# Patient Record
Sex: Female | Born: 1975 | Race: Black or African American | Hispanic: No | Marital: Married | Smoking: Former smoker
Health system: Southern US, Community
[De-identification: ages and names within clinical notes are randomized; demographics above are authoritative.]

## PROBLEM LIST (undated history)

## (undated) DIAGNOSIS — K589 Irritable bowel syndrome without diarrhea: Secondary | ICD-10-CM

## (undated) DIAGNOSIS — K219 Gastro-esophageal reflux disease without esophagitis: Secondary | ICD-10-CM

## (undated) DIAGNOSIS — D649 Anemia, unspecified: Secondary | ICD-10-CM

## (undated) DIAGNOSIS — E119 Type 2 diabetes mellitus without complications: Secondary | ICD-10-CM

## (undated) DIAGNOSIS — E282 Polycystic ovarian syndrome: Secondary | ICD-10-CM

## (undated) HISTORY — PX: POLYPECTOMY: SHX149

---

## 1997-11-20 ENCOUNTER — Emergency Department (HOSPITAL_COMMUNITY): Admission: EM | Admit: 1997-11-20 | Discharge: 1997-11-20 | Payer: Self-pay | Admitting: Emergency Medicine

## 1998-03-08 ENCOUNTER — Emergency Department (HOSPITAL_COMMUNITY): Admission: EM | Admit: 1998-03-08 | Discharge: 1998-03-09 | Payer: Self-pay | Admitting: Emergency Medicine

## 1998-06-03 ENCOUNTER — Emergency Department (HOSPITAL_COMMUNITY): Admission: EM | Admit: 1998-06-03 | Discharge: 1998-06-04 | Payer: Self-pay | Admitting: Internal Medicine

## 1999-06-02 ENCOUNTER — Emergency Department (HOSPITAL_COMMUNITY): Admission: EM | Admit: 1999-06-02 | Discharge: 1999-06-03 | Payer: Self-pay | Admitting: Emergency Medicine

## 1999-06-09 ENCOUNTER — Encounter: Admission: RE | Admit: 1999-06-09 | Discharge: 1999-06-09 | Payer: Self-pay | Admitting: Internal Medicine

## 1999-06-19 ENCOUNTER — Inpatient Hospital Stay (HOSPITAL_COMMUNITY): Admission: AD | Admit: 1999-06-19 | Discharge: 1999-06-19 | Payer: Self-pay | Admitting: Obstetrics & Gynecology

## 1999-06-24 ENCOUNTER — Encounter: Admission: RE | Admit: 1999-06-24 | Discharge: 1999-06-24 | Payer: Self-pay | Admitting: Obstetrics & Gynecology

## 1999-07-14 ENCOUNTER — Encounter: Admission: RE | Admit: 1999-07-14 | Discharge: 1999-07-14 | Payer: Self-pay | Admitting: Internal Medicine

## 2000-09-22 ENCOUNTER — Other Ambulatory Visit: Admission: RE | Admit: 2000-09-22 | Discharge: 2000-09-22 | Payer: Self-pay | Admitting: Obstetrics and Gynecology

## 2000-12-21 ENCOUNTER — Emergency Department (HOSPITAL_COMMUNITY): Admission: EM | Admit: 2000-12-21 | Discharge: 2000-12-22 | Payer: Self-pay | Admitting: Emergency Medicine

## 2000-12-21 ENCOUNTER — Encounter: Payer: Self-pay | Admitting: Emergency Medicine

## 2000-12-29 ENCOUNTER — Encounter: Admission: RE | Admit: 2000-12-29 | Discharge: 2000-12-29 | Payer: Self-pay | Admitting: Gastroenterology

## 2000-12-29 ENCOUNTER — Encounter: Payer: Self-pay | Admitting: Gastroenterology

## 2001-02-14 ENCOUNTER — Ambulatory Visit (HOSPITAL_COMMUNITY): Admission: RE | Admit: 2001-02-14 | Discharge: 2001-02-14 | Payer: Self-pay | Admitting: Gastroenterology

## 2001-11-01 ENCOUNTER — Other Ambulatory Visit: Admission: RE | Admit: 2001-11-01 | Discharge: 2001-11-01 | Payer: Self-pay | Admitting: Obstetrics and Gynecology

## 2002-11-02 ENCOUNTER — Other Ambulatory Visit: Admission: RE | Admit: 2002-11-02 | Discharge: 2002-11-02 | Payer: Self-pay | Admitting: *Deleted

## 2003-10-11 ENCOUNTER — Ambulatory Visit (HOSPITAL_COMMUNITY): Admission: RE | Admit: 2003-10-11 | Discharge: 2003-10-11 | Payer: Self-pay | Admitting: Obstetrics and Gynecology

## 2003-11-07 ENCOUNTER — Other Ambulatory Visit: Admission: RE | Admit: 2003-11-07 | Discharge: 2003-11-07 | Payer: Self-pay | Admitting: Obstetrics and Gynecology

## 2004-11-05 ENCOUNTER — Other Ambulatory Visit: Admission: RE | Admit: 2004-11-05 | Discharge: 2004-11-05 | Payer: Self-pay | Admitting: Obstetrics and Gynecology

## 2010-06-15 ENCOUNTER — Encounter: Payer: Self-pay | Admitting: Family Medicine

## 2011-06-06 ENCOUNTER — Emergency Department (HOSPITAL_COMMUNITY): Payer: Managed Care, Other (non HMO)

## 2011-06-06 ENCOUNTER — Other Ambulatory Visit: Payer: Self-pay

## 2011-06-06 ENCOUNTER — Encounter (HOSPITAL_COMMUNITY): Payer: Self-pay | Admitting: *Deleted

## 2011-06-06 ENCOUNTER — Emergency Department (HOSPITAL_COMMUNITY)
Admission: EM | Admit: 2011-06-06 | Discharge: 2011-06-06 | Disposition: A | Discharge status: Home / Self Care | Payer: Managed Care, Other (non HMO) | Attending: Emergency Medicine | Admitting: Emergency Medicine

## 2011-06-06 DIAGNOSIS — R05 Cough: Secondary | ICD-10-CM | POA: Insufficient documentation

## 2011-06-06 DIAGNOSIS — R0602 Shortness of breath: Secondary | ICD-10-CM | POA: Insufficient documentation

## 2011-06-06 DIAGNOSIS — R0789 Other chest pain: Secondary | ICD-10-CM | POA: Insufficient documentation

## 2011-06-06 DIAGNOSIS — R059 Cough, unspecified: Secondary | ICD-10-CM | POA: Insufficient documentation

## 2011-06-06 LAB — CBC
Hemoglobin: 11.4 g/dL — ABNORMAL LOW (ref 12.0–15.0)
MCHC: 32.8 g/dL (ref 30.0–36.0)
RBC: 4.6 MIL/uL (ref 3.87–5.11)
WBC: 7.1 10*3/uL (ref 4.0–10.5)

## 2011-06-06 LAB — BASIC METABOLIC PANEL
GFR calc non Af Amer: >90 mL/min (ref >90)
Glucose, Bld: 171 mg/dL — ABNORMAL HIGH (ref 70–99)
Potassium: 3.8 mEq/L (ref 3.5–5.1)
Sodium: 136 mEq/L (ref 135–145)

## 2011-06-06 LAB — URINALYSIS, ROUTINE W REFLEX MICROSCOPIC
Hgb urine dipstick: NEGATIVE
Nitrite: NEGATIVE
Specific Gravity, Urine: 1.024 (ref 1.005–1.030)
Urobilinogen, UA: 0.2 mg/dL (ref 0.0–1.0)

## 2011-06-06 LAB — POCT I-STAT TROPONIN I: Troponin i, poc: 0 ng/mL (ref 0.00–0.08)

## 2011-06-06 LAB — POCT PREGNANCY, URINE: Preg Test, Ur: NEGATIVE

## 2011-06-06 LAB — URINE MICROSCOPIC-ADD ON

## 2011-06-06 NOTE — ED Notes (Signed)
The pt is c/o chest pain for one hour she has a cold and sinus infectiion.

## 2011-06-06 NOTE — ED Provider Notes (Signed)
History     CSN: [id number]  Arrival date & time 06/06/11  2044   First MD Initiated Contact with Patient 06/06/11 2222      Chief Complaint  Patient presents with  . Chest Pain    (Consider location/radiation/quality/duration/timing/severity/associated sxs/prior treatment) HPI History provided by pt.   Pt developed constant, diffuse, right-sided CP w/ associated SOB while driving approx 3 hours ago.  Has had similar sx in the past, always non-exertional, but has never been evaluated.  Has had a cough for the past few days.  No known fever.  No recent trauma or heavy lifting.  H/o acid reflux but this pain feels different.  RF for ACS include FH.    History reviewed. No pertinent past medical history.  History reviewed. No pertinent past surgical history.  History reviewed. No pertinent family history.  History  Substance Use Topics  . Smoking status: Never Smoker   . Smokeless tobacco: Not on file  . Alcohol Use: Yes    OB History    Grav Para Term Preterm Abortions TAB SAB Ect Mult Living                  Review of Systems  All other systems reviewed and are negative.    Allergies  Review of patient's allergies indicates no known allergies.  Home Medications   Current Outpatient Rx  Name Route Sig Dispense Refill  . FAMOTIDINE 20 MG PO TABS Oral Take 40 mg by mouth 2 (two) times daily.    . IBUPROFEN 200 MG PO TABS Oral Take 200 mg by mouth every 6 (six) hours as needed. For pain      BP 123/84  Pulse 88  Temp(Src) 98.4 F (36.9 C) (Oral)  Resp 18  SpO2 97%  LMP 05/06/2011  Physical Exam  Nursing note and vitals reviewed. Constitutional: She is oriented to person, place, and time. She appears well-developed and well-nourished. No distress.  HENT:  Head: Normocephalic and atraumatic.  Eyes:       Normal appearance  Neck: Normal range of motion.  Cardiovascular: Normal rate and regular rhythm.   Pulmonary/Chest: Effort normal and breath sounds  normal. No respiratory distress.       Diffuse right-sided chest tenderness.  Pain reproduced by flexion and and abduction of RUE against resistance as well.   Musculoskeletal:       No peripheral edema or calf ttp  Neurological: She is alert and oriented to person, place, and time.  Skin: Skin is warm and dry. No rash noted.  Psychiatric: She has a normal mood and affect. Her behavior is normal.    ED Course  Procedures (including critical care time)  Date: 06/07/2011  Rate: 87  Rhythm: normal sinus rhythm  QRS Axis: normal  Intervals: normal  ST/T Wave abnormalities: normal  Conduction Disutrbances:none  Narrative Interpretation:   Old EKG Reviewed: none available   Labs Reviewed  CBC - Abnormal; Notable for the following:    Hemoglobin 11.4 (*)    HCT 34.8 (*)    MCV 75.7 (*)    MCH 24.8 (*)    RDW 15.9 (*)    All other components within normal limits  BASIC METABOLIC PANEL - Abnormal; Notable for the following:    Glucose, Bld 171 (*)    All other components within normal limits  URINALYSIS, ROUTINE W REFLEX MICROSCOPIC - Abnormal; Notable for the following:    APPearance CLOUDY (*)    Leukocytes, UA TRACE (*)  All other components within normal limits  URINE MICROSCOPIC-ADD ON - Abnormal; Notable for the following:    Squamous Epithelial / LPF FEW (*)    Bacteria, UA FEW (*)    All other components within normal limits  POCT PREGNANCY, URINE  POCT I-STAT TROPONIN I  I-STAT TROPONIN I  POCT PREGNANCY, URINE   Dg Chest 2 View  06/06/2011  *RADIOLOGY REPORT*  Clinical Data: Right-sided chest pain and shortness of breath.  CHEST - 2 VIEW  Comparison: None.  Findings: The lungs are well-aerated and clear.  There is no evidence of focal opacification, pleural effusion or pneumothorax.  The heart is normal in size; the mediastinal contour is within normal limits.  No acute osseous abnormalities are seen.  IMPRESSION: No acute cardiopulmonary process seen.  Original  Report Authenticated By: Tonia Ghent, M.D.     1. Musculoskeletal chest pain       MDM  Healthy 35yo F, low risk for ACS and PE presents w/ atypical CP that is reproducible on exam.  EKG non-ischemic, troponin neg and CXR and rest of labs unremarkable.  Pt has been reassured that pain is likely musculoskeletal.  Recommended NSAID, rest and close f/u with PCP.  Strict return precautions discussed.         Otilio Miu, [place] 06/07/11 [phone]

## 2011-06-06 NOTE — ED Notes (Signed)
She does not know when her last period was it is irregulat

## 2011-06-06 NOTE — ED Notes (Signed)
Pt placed on cardiac monitor and pulse ox.

## 2011-06-06 NOTE — ED Notes (Signed)
Pt states she has been having right sided CP that radiates to the upper right side of her back since 2030 while at the grocery store.  Pt states she has been also having a lot of gas.  Pt takes prevacid for acid reflux and took that this morning.  Pt stated she took some baking soda to relieve the chest pain.  Last bowel movement was today.  Pt also states she has been having sense of urgency to urinate.  Denies dysuria

## 2011-06-06 NOTE — ED Notes (Signed)
EKG completed by EMT Les. Given to Dr. Ranae Palms.

## 2011-06-07 NOTE — ED Provider Notes (Signed)
Medical screening examination/treatment/procedure(s) were performed by non-physician practitioner and as supervising physician I was immediately available for consultation/collaboration.   Geoffery Lyons, MD 06/07/11 2045

## 2013-10-24 ENCOUNTER — Encounter (HOSPITAL_COMMUNITY): Payer: Self-pay | Admitting: Emergency Medicine

## 2013-10-24 DIAGNOSIS — R1012 Left upper quadrant pain: Secondary | ICD-10-CM | POA: Insufficient documentation

## 2013-10-24 DIAGNOSIS — K589 Irritable bowel syndrome without diarrhea: Secondary | ICD-10-CM | POA: Insufficient documentation

## 2013-10-24 DIAGNOSIS — K219 Gastro-esophageal reflux disease without esophagitis: Secondary | ICD-10-CM | POA: Insufficient documentation

## 2013-10-24 DIAGNOSIS — Z79899 Other long term (current) drug therapy: Secondary | ICD-10-CM | POA: Insufficient documentation

## 2013-10-24 DIAGNOSIS — E282 Polycystic ovarian syndrome: Secondary | ICD-10-CM | POA: Insufficient documentation

## 2013-10-24 LAB — CBC WITH DIFFERENTIAL/PLATELET
Basophils Absolute: 0.1 10*3/uL (ref 0.0–0.1)
Basophils Relative: 1 % (ref 0–1)
EOS ABS: 0.7 10*3/uL (ref 0.0–0.7)
EOS PCT: 7 % — AB (ref 0–5)
HCT: 37.2 % (ref 36.0–46.0)
Hemoglobin: 12.5 g/dL (ref 12.0–15.0)
LYMPHS ABS: 3.3 10*3/uL (ref 0.7–4.0)
LYMPHS PCT: 29 % (ref 12–46)
MCH: 25.3 pg — ABNORMAL LOW (ref 26.0–34.0)
MCHC: 33.6 g/dL (ref 30.0–36.0)
MCV: 75.3 fL — ABNORMAL LOW (ref 78.0–100.0)
MONO ABS: 0.5 10*3/uL (ref 0.1–1.0)
MONOS PCT: 4 % (ref 3–12)
NEUTROS ABS: 6.5 10*3/uL (ref 1.7–7.7)
NEUTROS PCT: 59 % (ref 43–77)
PLATELETS: 318 10*3/uL (ref 150–400)
RBC: 4.94 MIL/uL (ref 3.87–5.11)
RDW: 15.4 % (ref 11.5–15.5)
WBC: 11.2 10*3/uL — AB (ref 4.0–10.5)

## 2013-10-24 LAB — URINALYSIS, ROUTINE W REFLEX MICROSCOPIC
Glucose, UA: NEGATIVE mg/dL
HGB URINE DIPSTICK: NEGATIVE
KETONES UR: 15 mg/dL — AB
Nitrite: NEGATIVE
PROTEIN: NEGATIVE mg/dL
Specific Gravity, Urine: 1.024 (ref 1.005–1.030)
UROBILINOGEN UA: 1 mg/dL (ref 0.0–1.0)
pH: 5.5 (ref 5.0–8.0)

## 2013-10-24 LAB — COMPREHENSIVE METABOLIC PANEL
ALK PHOS: 60 U/L (ref 39–117)
ALT: 21 U/L (ref 0–35)
AST: 26 U/L (ref 0–37)
Albumin: 3.3 g/dL — ABNORMAL LOW (ref 3.5–5.2)
BUN: 7 mg/dL (ref 6–23)
CALCIUM: 9.1 mg/dL (ref 8.4–10.5)
CO2: 24 mEq/L (ref 19–32)
Chloride: 102 mEq/L (ref 96–112)
Creatinine, Ser: 0.74 mg/dL (ref 0.50–1.10)
GLUCOSE: 145 mg/dL — AB (ref 70–99)
Potassium: 3.7 mEq/L (ref 3.7–5.3)
Sodium: 138 mEq/L (ref 137–147)
TOTAL PROTEIN: 7.8 g/dL (ref 6.0–8.3)
Total Bilirubin: 0.2 mg/dL — ABNORMAL LOW (ref 0.3–1.2)

## 2013-10-24 LAB — URINE MICROSCOPIC-ADD ON

## 2013-10-24 LAB — LIPASE, BLOOD: LIPASE: 62 U/L — AB (ref 11–59)

## 2013-10-24 LAB — PREGNANCY, URINE: Preg Test, Ur: NEGATIVE

## 2013-10-24 NOTE — ED Notes (Signed)
Pt. reports LUQ pain with nausea and diarrhea for 1 week , denies fever or chills. History of IBS.

## 2013-10-25 ENCOUNTER — Emergency Department (HOSPITAL_COMMUNITY)
Admission: EM | Admit: 2013-10-25 | Discharge: 2013-10-25 | Disposition: A | Discharge status: Home / Self Care | Payer: Managed Care, Other (non HMO) | Attending: Emergency Medicine | Admitting: Emergency Medicine

## 2013-10-25 DIAGNOSIS — R109 Unspecified abdominal pain: Secondary | ICD-10-CM

## 2013-10-25 HISTORY — DX: Anemia, unspecified: D64.9

## 2013-10-25 HISTORY — DX: Irritable bowel syndrome, unspecified: K58.9

## 2013-10-25 HISTORY — DX: Polycystic ovarian syndrome: E28.2

## 2013-10-25 HISTORY — DX: Type 2 diabetes mellitus without complications: E11.9

## 2013-10-25 HISTORY — DX: Gastro-esophageal reflux disease without esophagitis: K21.9

## 2013-10-25 MED ORDER — ONDANSETRON 4 MG PO TBDP
ORAL_TABLET | ORAL | Status: AC
  Filled 2013-10-25: qty 1

## 2013-10-25 MED ORDER — HYOSCYAMINE SULFATE 0.125 MG PO TABS: 0.2500 mg | ORAL_TABLET | Freq: Four times a day (QID) | ORAL | Status: AC | PRN

## 2013-10-25 MED ORDER — OXYCODONE-ACETAMINOPHEN 5-325 MG PO TABS: 1.0000 | ORAL_TABLET | ORAL | Status: DC | PRN

## 2013-10-25 MED ORDER — OXYCODONE-ACETAMINOPHEN 5-325 MG PO TABS
2.0000 | ORAL_TABLET | Freq: Once | ORAL | Status: AC
  Administered 2013-10-25: 2 via ORAL
  Filled 2013-10-25: qty 2

## 2013-10-25 MED ORDER — SODIUM CHLORIDE 0.9 % IV SOLN
1000.0000 mL | Freq: Once | INTRAVENOUS | Status: AC
  Administered 2013-10-25: 1000 mL via INTRAVENOUS

## 2013-10-25 MED ORDER — ONDANSETRON 8 MG PO TBDP: 8.0000 mg | ORAL_TABLET | Freq: Three times a day (TID) | ORAL | Status: DC | PRN

## 2013-10-25 MED ORDER — HYDROMORPHONE HCL PF 1 MG/ML IJ SOLN
1.0000 mg | Freq: Once | INTRAMUSCULAR | Status: AC
  Administered 2013-10-25: 1 mg via INTRAVENOUS
  Filled 2013-10-25: qty 1

## 2013-10-25 MED ORDER — HYOSCYAMINE SULFATE 0.125 MG PO TABS
0.2500 mg | ORAL_TABLET | Freq: Once | ORAL | Status: AC
  Filled 2013-10-25: qty 2

## 2013-10-25 MED ORDER — SODIUM CHLORIDE 0.9 % IV SOLN
1000.0000 mL | INTRAVENOUS | Status: DC
  Administered 2013-10-25: 1000 mL via INTRAVENOUS

## 2013-10-25 MED ORDER — HYOSCYAMINE SULFATE 0.125 MG PO TABS
0.2500 mg | ORAL_TABLET | Freq: Once | ORAL | Status: AC
  Administered 2013-10-25: 0.25 mg via ORAL

## 2013-10-25 MED ORDER — ONDANSETRON 4 MG PO TBDP
4.0000 mg | ORAL_TABLET | Freq: Once | ORAL | Status: AC
  Administered 2013-10-25: 4 mg via ORAL

## 2013-10-25 NOTE — ED Provider Notes (Signed)
CSN: [id number]     Arrival date & time 10/24/13  2210 History   First MD Initiated Contact with Patient 10/25/13 0119     Chief Complaint  Patient presents with  . Abdominal Pain      HPI Patient reports worsening left-sided abdominal pain over the past week.  She states she's had nausea and diarrhea and the main issue that brings her in today for diarrhea is persistent.  No blood noted no diarrhea.  No recent travel.  No sick contacts.  She saw her physicians who ordered an outpatient CT.  This was completed approximately 5 days ago demonstrated no abnormalities.  She was placed on Flagyl that time as she is continuing Flagyl currently.  She's not doing anything for pain at home.  She does have a history of IBS.  She denies back pain.  No chest pain or shortness of breath.  Symptoms are mild to moderate in severity.   Past Medical History  Diagnosis Date  . IBS (irritable bowel syndrome)   . Diabetes mellitus without complication   . GERD (gastroesophageal reflux disease)   . PCOS (polycystic ovarian syndrome)   . Anemia    History reviewed. No pertinent past surgical history. No family history on file. History  Substance Use Topics  . Smoking status: Never Smoker   . Smokeless tobacco: Not on file  . Alcohol Use: Yes   OB History   Grav Para Term Preterm Abortions TAB SAB Ect Mult Living                 Review of Systems  All other systems reviewed and are negative.     Allergies  Review of patient's allergies indicates no known allergies.  Home Medications   Prior to Admission medications   Medication Sig Start Date End Date Taking? Authorizing Provider  Ginger, Zingiber officinalis, (GINGER PO) Take 2 capsules by mouth daily.   Yes Historical Provider, MD  ibuprofen (ADVIL,MOTRIN) 200 MG tablet Take 200 mg by mouth every 6 (six) hours as needed. For pain   Yes Historical Provider, MD  lansoprazole (PREVACID) 30 MG capsule Take 30 mg by mouth daily at 12 noon.    Yes Historical Provider, MD  MACA ROOT PO Take 2 tablets by mouth daily.   Yes Historical Provider, MD  metroNIDAZOLE (FLAGYL) 500 MG tablet Take 500 mg by mouth 4 (four) times daily.   Yes Historical Provider, MD  hyoscyamine (LEVSIN, ANASPAZ) 0.125 MG tablet Take 2 tablets (0.25 mg total) by mouth every 6 (six) hours as needed. 10/25/13   Lyanne Co, MD  ondansetron (ZOFRAN ODT) 8 MG disintegrating tablet Take 1 tablet (8 mg total) by mouth every 8 (eight) hours as needed for nausea or vomiting. 10/25/13   Lyanne Co, MD  oxyCODONE-acetaminophen (PERCOCET/ROXICET) 5-325 MG per tablet Take 1 tablet by mouth every 4 (four) hours as needed for severe pain. 10/25/13   Lyanne Co, MD   BP 105/75  Pulse 73  Temp(Src) 98.6 F (37 C) (Oral)  Resp 15  Ht 5' (1.524 m)  Wt 179 lb (81.194 kg)  BMI 34.96 kg/m2  SpO2 100%  LMP 10/07/2013 Physical Exam  Nursing note and vitals reviewed. Constitutional: She is oriented to person, place, and time. She appears well-developed and well-nourished. No distress.  HENT:  Head: Normocephalic and atraumatic.  Eyes: EOM are normal.  Neck: Normal range of motion.  Cardiovascular: Normal rate, regular rhythm and normal heart sounds.  Pulmonary/Chest: Effort normal and breath sounds normal.  Abdominal: Soft. She exhibits no distension. There is no tenderness. There is no rebound and no guarding.  Musculoskeletal: Normal range of motion.  Neurological: She is alert and oriented to person, place, and time.  Skin: Skin is warm and dry.  Psychiatric: She has a normal mood and affect. Judgment normal.    ED Course  Procedures (including critical care time) Labs Review Labs Reviewed  CBC WITH DIFFERENTIAL - Abnormal; Notable for the following:    WBC 11.2 (*)    MCV 75.3 (*)    MCH 25.3 (*)    Eosinophils Relative 7 (*)    All other components within normal limits  COMPREHENSIVE METABOLIC PANEL - Abnormal; Notable for the following:    Glucose, Bld  145 (*)    Albumin 3.3 (*)    Total Bilirubin 0.2 (*)    All other components within normal limits  LIPASE, BLOOD - Abnormal; Notable for the following:    Lipase 62 (*)    All other components within normal limits  URINALYSIS, ROUTINE W REFLEX MICROSCOPIC - Abnormal; Notable for the following:    APPearance CLOUDY (*)    Bilirubin Urine SMALL (*)    Ketones, ur 15 (*)    Leukocytes, UA SMALL (*)    All other components within normal limits  URINE MICROSCOPIC-ADD ON - Abnormal; Notable for the following:    Squamous Epithelial / LPF MANY (*)    Bacteria, UA FEW (*)    All other components within normal limits  PREGNANCY, URINE    Imaging Review [facility] Date of Birth: [birth year] Sex: F Admit Date: 10/20/2013 11:08  ###FINAL RESULT###  INDICATIONS: ABDOMEN PAIN FOR 5-7 DAYS COMMENTS:  PROCEDURE: QCT [id number] CT ABD/PELVIS W/CONT - Oct 20 2013  [facility] Accession #: [id number] [facility] Accession #: [id number] CT ABDOMEN AND PELVIS WITH IV CONTRAST TECHNIQUE: Multiple axial images through the abdomen and pelvis were  performed after the administration of 80 mL of Isovue 370. Coronal and  sagittal reconstructions were obtained and reviewed. COMPARISON: Ultrasound 08/22/2013. INDICATION: Left upper quadrant pain and diarrhea. FINDINGS: Imaged Lower Thorax: Unremarkable Abdomen/Pelvis: No bowel obstruction or abnormal bowel wall thickening. Normal appendix.  Unremarkable small bowel and stomach. Mild hepatic steatosis, no focal  lesion or biliary dilatation. The gallbladder, spleen, adrenal glands and  bladder are unremarkable. The uterus and ovaries are present. Bilateral  renal cysts. No stones or hydronephrosis. No free fluid, free air or adenopathy. Normal caliber abdominal aorta and  IVC. No significant bone abnormality. No destructive bone lesions.  IMPRESSION: No acute findings to explain etiology of patients pain. Mild hepatic steatosis.      EKG  Interpretation None      MDM   Final diagnoses:  Abdominal pain    3:58 AM Patient feels better at this time.  Labs without significant abnormality.  Mild leukocytosis noted.  Patient is completing her course of Flagyl at this time.  She'll continue to complete this course.  GI followup.  The patient's pain controlled in the emergency department.  Home with Levsin, Percocet.  She understands return to the ER for new or worsening symptoms    Lyanne CoKevin M Nickisha Hum, MD 10/25/13 0400

## 2013-10-25 NOTE — Discharge Instructions (Signed)
Abdominal Pain, Adult °Many things can cause abdominal pain. Usually, abdominal pain is not caused by a disease and will improve without treatment. It can often be observed and treated at home. Your health care provider will do a physical exam and possibly order blood tests and X-rays to help determine the seriousness of your pain. However, in many cases, more time must pass before a clear cause of the pain can be found. Before that point, your health care provider may not know if you need more testing or further treatment. °HOME CARE INSTRUCTIONS  °Monitor your abdominal pain for any changes. The following actions may help to alleviate any discomfort you are experiencing: °· Only take over-the-counter or prescription medicines as directed by your health care provider. °· Do not take laxatives unless directed to do so by your health care provider. °· Try a clear liquid diet (broth, tea, or water) as directed by your health care provider. Slowly move to a bland diet as tolerated. °SEEK MEDICAL CARE IF: °· You have unexplained abdominal pain. °· You have abdominal pain associated with nausea or diarrhea. °· You have pain when you urinate or have a bowel movement. °· You experience abdominal pain that wakes you in the night. °· You have abdominal pain that is worsened or improved by eating food. °· You have abdominal pain that is worsened with eating fatty foods. °SEEK IMMEDIATE MEDICAL CARE IF:  °· Your pain does not go away within 2 hours. °· You have a fever. °· You keep throwing up (vomiting). °· Your pain is felt only in portions of the abdomen, such as the right side or the left lower portion of the abdomen. °· You pass bloody or black tarry stools. °MAKE SURE YOU: °· Understand these instructions.   °· Will watch your condition.   °· Will get help right away if you are not doing well or get worse.   °Document Released: 02/18/2005 Document Revised: 03/01/2013 Document Reviewed: 01/18/2013 °Exit[employer]® Patient  Information ©2014 [employer]. ° °

## 2019-05-29 ENCOUNTER — Other Ambulatory Visit: Payer: Managed Care, Other (non HMO)

## 2019-06-02 ENCOUNTER — Other Ambulatory Visit: Payer: Managed Care, Other (non HMO)

## 2019-08-11 ENCOUNTER — Ambulatory Visit: Payer: Managed Care, Other (non HMO) | Attending: Internal Medicine

## 2019-08-11 DIAGNOSIS — Z23 Encounter for immunization: Secondary | ICD-10-CM

## 2019-08-11 NOTE — Progress Notes (Signed)
   Covid-19 Vaccination Clinic  Name:  Lindsay Gardner    MRN: [id number] DOB: [birth year]  08/11/2019  Lindsay Gardner was observed post Covid-19 immunization for 15 minutes without incident. She was provided with Vaccine Information Sheet and instruction to access the V-Safe system.   Lindsay Gardner was instructed to call 911 with any severe reactions post vaccine: Marland Kitchen Difficulty breathing  . Swelling of face and throat  . A fast heartbeat  . A bad rash all over body  . Dizziness and weakness   Immunizations Administered    Name Date Dose VIS Date Route   Pfizer COVID-19 Vaccine 08/11/2019  4:57 PM 0.3 mL 05/05/2019 Intramuscular   Manufacturer: [employer], [employer]   Lot: [id number]   NDC: [address]-3

## 2019-09-06 ENCOUNTER — Ambulatory Visit: Payer: Managed Care, Other (non HMO) | Attending: Internal Medicine

## 2019-09-06 DIAGNOSIS — Z23 Encounter for immunization: Secondary | ICD-10-CM

## 2019-09-06 NOTE — Progress Notes (Signed)
   Covid-19 Vaccination Clinic  Name:  Lindsay Gardner    MRN: [id number] DOB: [birth year]  09/06/2019  Ms. Lindsay Gardner was observed post Covid-19 immunization for 15 minutes without incident. She was provided with Vaccine Information Sheet and instruction to access the V-Safe system.   Ms. Lindsay Gardner was instructed to call 911 with any severe reactions post vaccine: Marland Kitchen Difficulty breathing  . Swelling of face and throat  . A fast heartbeat  . A bad rash all over body  . Dizziness and weakness   Immunizations Administered    Name Date Dose VIS Date Route   Pfizer COVID-19 Vaccine 09/06/2019  3:50 PM 0.3 mL 05/05/2019 Intramuscular   Manufacturer: [employer], [employer]   Lot: [id number]   NDC: [address]-4

## 2019-12-11 ENCOUNTER — Ambulatory Visit: Payer: Self-pay

## 2019-12-11 ENCOUNTER — Telehealth: Payer: Managed Care, Other (non HMO)

## 2020-02-21 ENCOUNTER — Ambulatory Visit (INDEPENDENT_AMBULATORY_CARE_PROVIDER_SITE_OTHER): Payer: Managed Care, Other (non HMO) | Admitting: Obstetrics & Gynecology

## 2020-02-21 ENCOUNTER — Other Ambulatory Visit: Payer: Self-pay

## 2020-02-21 ENCOUNTER — Encounter: Payer: Self-pay | Admitting: Obstetrics & Gynecology

## 2020-02-21 ENCOUNTER — Other Ambulatory Visit (HOSPITAL_COMMUNITY)
Admission: RE | Admit: 2020-02-21 | Discharge: 2020-02-21 | Disposition: A | Discharge status: Home / Self Care | Payer: Managed Care, Other (non HMO) | Source: Ambulatory Visit | Attending: Obstetrics & Gynecology | Admitting: Obstetrics & Gynecology

## 2020-02-21 VITALS — BP 117/81 | HR 97 | Ht 61.0 in | Wt 178.0 lb

## 2020-02-21 DIAGNOSIS — Z1231 Encounter for screening mammogram for malignant neoplasm of breast: Secondary | ICD-10-CM | POA: Diagnosis not present

## 2020-02-21 DIAGNOSIS — N939 Abnormal uterine and vaginal bleeding, unspecified: Secondary | ICD-10-CM | POA: Diagnosis not present

## 2020-02-21 DIAGNOSIS — Z1329 Encounter for screening for other suspected endocrine disorder: Secondary | ICD-10-CM

## 2020-02-21 DIAGNOSIS — Z01419 Encounter for gynecological examination (general) (routine) without abnormal findings: Secondary | ICD-10-CM | POA: Diagnosis present

## 2020-02-21 DIAGNOSIS — N920 Excessive and frequent menstruation with regular cycle: Secondary | ICD-10-CM

## 2020-02-21 MED ORDER — TRANEXAMIC ACID 650 MG PO TABS: 1300.0000 mg | ORAL_TABLET | Freq: Three times a day (TID) | ORAL | 2 refills | Status: AC

## 2020-02-21 NOTE — Progress Notes (Signed)
Last mammogram at [facility] center in Sept 2020 Last pap WNL at [facility] Sept 15 2020. Armandina Stammer RN

## 2020-02-21 NOTE — Progress Notes (Signed)
Subjective:     Lindsay Gardner is a 44 y.o. female here for a routine exam. G1P0010 Pt reports heavy cycles leading to anemia for many years. She is on meds for menorrhagia but, has never been managed for the menorrhagia She has had PCOs and had a workup, She was on metformin and clomid prev with no subsequent pregnancy. She bleeds 6-7 days per cycles and heavy with clots.  She has DM which is controlled.   Gynecologic History Patient's last menstrual period was 02/05/2020 (exact date). Contraception: none Last Pap: 1 years prev. Results were: normal Last mammogram: 1 year prev Results were: normal  Obstetric History G1P0010   The following portions of the patient's history were reviewed and updated as appropriate: allergies, current medications, past family history, past medical history, past social history, past surgical history and problem list.  Review of Systems Pertinent items are noted in HPI.    Objective:  BP 117/81   Pulse 97   Ht 5\' 1"  (1.549 m)   Wt 178 lb (80.7 kg)   LMP 02/05/2020 (Exact Date)   BMI 33.63 kg/m   General Appearance:    Alert, cooperative, no distress, appears stated age  Head:    Normocephalic, without obvious abnormality, atraumatic  Eyes:    conjunctiva/corneas clear, EOM's intact, both eyes  Ears:    Normal external ear canals, both ears  Nose:   Nares normal, septum midline, mucosa normal, no drainage    or sinus tenderness  Throat:   Lips, mucosa, and tongue normal; teeth and gums normal  Neck:   Supple, symmetrical, trachea midline, no adenopathy;    thyroid:  no enlargement/tenderness/nodules  Back:     Symmetric, no curvature, ROM normal, no CVA tenderness  Lungs:     respirations unlabored  Chest Wall:    No tenderness or deformity   Heart:    Regular rate and rhythm  Breast Exam:    No tenderness, masses, or nipple abnormality  Abdomen:     Soft, non-tender, bowel sounds active all four quadrants,    no masses, no organomegaly   Genitalia:    Normal female without lesion, discharge or tenderness   Uterus small and mobile.   Extremities:   Extremities normal, atraumatic, no cyanosis or edema  Pulses:   2+ and symmetric all extremities  Skin:   Skin color, texture, turgor normal, no rashes or lesions    Assessment:    Healthy female exam.   H/o PCOS menorrhagia with reg cycles.  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 Her husband is not open ot adoption or fostering. She would consider eval by [facility]. Not ready for referral yet.      Plan:  Lindsay Gardner was seen today for annual exam.  Diagnoses and all orders for this visit:  Well female exam with routine gynecological exam -     CBC -     TSH  Screening for thyroid disorder -     TSH  Screening mammogram, encounter for -     MS Digital Screening; Future  Abnormal uterine bleeding (AUB)  Menorrhagia with regular cycle -     tranexamic acid (LYSTEDA) 650 MG TABS tablet; Take 2 tablets (1,300 mg total) by mouth 3 (three) times daily. Take during menses for a maximum of five days  f/u in 3 months or assess bleeding and Hgb.  Tramond Slinker L. Harraway-Smith, M.D., [place]

## 2020-02-22 LAB — CBC
Hematocrit: 38.4 % (ref 34.0–46.6)
Hemoglobin: 12.9 g/dL (ref 11.1–15.9)
MCH: 26.8 pg (ref 26.6–33.0)
MCHC: 33.6 g/dL (ref 31.5–35.7)
MCV: 80 fL (ref 79–97)
Platelets: 341 10*3/uL (ref 150–450)
RBC: 4.82 x10E6/uL (ref 3.77–5.28)
RDW: 14.6 % (ref 11.7–15.4)
WBC: 6.3 10*3/uL (ref 3.4–10.8)

## 2020-02-22 LAB — TSH: TSH: 2.07 u[IU]/mL (ref 0.450–4.500)

## 2020-02-23 LAB — CYTOLOGY - PAP
Comment: NEGATIVE
Diagnosis: NEGATIVE
High risk HPV: NEGATIVE

## 2020-03-21 ENCOUNTER — Other Ambulatory Visit: Payer: Self-pay

## 2020-03-21 ENCOUNTER — Ambulatory Visit
Admission: RE | Admit: 2020-03-21 | Discharge: 2020-03-21 | Disposition: A | Discharge status: Home / Self Care | Payer: Managed Care, Other (non HMO) | Source: Ambulatory Visit | Attending: Obstetrics & Gynecology | Admitting: Obstetrics & Gynecology

## 2020-03-21 DIAGNOSIS — Z1231 Encounter for screening mammogram for malignant neoplasm of breast: Secondary | ICD-10-CM

## 2020-11-08 ENCOUNTER — Other Ambulatory Visit (HOSPITAL_COMMUNITY)
Admission: RE | Admit: 2020-11-08 | Discharge: 2020-11-08 | Disposition: A | Discharge status: Home / Self Care | Payer: Managed Care, Other (non HMO) | Source: Ambulatory Visit | Attending: Obstetrics & Gynecology | Admitting: Obstetrics & Gynecology

## 2020-11-08 ENCOUNTER — Encounter: Payer: Self-pay | Admitting: Obstetrics & Gynecology

## 2020-11-08 ENCOUNTER — Other Ambulatory Visit: Payer: Self-pay

## 2020-11-08 ENCOUNTER — Ambulatory Visit (INDEPENDENT_AMBULATORY_CARE_PROVIDER_SITE_OTHER): Payer: Managed Care, Other (non HMO) | Admitting: Obstetrics & Gynecology

## 2020-11-08 VITALS — BP 117/73 | HR 91 | Ht 61.0 in | Wt 174.0 lb

## 2020-11-08 DIAGNOSIS — R300 Dysuria: Secondary | ICD-10-CM

## 2020-11-08 DIAGNOSIS — N898 Other specified noninflammatory disorders of vagina: Secondary | ICD-10-CM

## 2020-11-08 LAB — POCT URINALYSIS DIPSTICK
Appearance: NORMAL
Bilirubin, UA: NEGATIVE
Blood, UA: NEGATIVE
Glucose, UA: NEGATIVE
Ketones, UA: NEGATIVE
Leukocytes, UA: NEGATIVE
Nitrite, UA: NEGATIVE
Protein, UA: NEGATIVE
Spec Grav, UA: 1.02 (ref 1.010–1.025)
Urobilinogen, UA: 0.2 E.U./dL
pH, UA: 6 (ref 5.0–8.0)

## 2020-11-08 NOTE — Progress Notes (Signed)
History:  45 y.o. G1P0010 here today for eval of brownish discharge with odor and lower pelvic pain. Pt reports that she initially thought that she has a UTI and she took a course of atbx that she had at home with no change in her sx. She also has concerns of a  forgotten tampon but, the odor is not extreme and has not been getting worse. She is not currently sexually active as her husband has had some medical issues.  Neither the pain or the odor are severe. She did have an episode of dehydration and was urinating a lot after excessive hydration. She wonders if these sx are related.     The following portions of the patient's history were reviewed and updated as appropriate: allergies, current medications, past family history, past medical history, past social history, past surgical history and problem list.  Review of Systems:  Pertinent items are noted in HPI.    Objective:  Physical Exam Blood pressure 117/73, pulse 91, height 5\' 1"  (1.549 m), weight 174 lb (78.9 kg), last menstrual period 10/13/2020.  CONSTITUTIONAL: Well-developed, well-nourished female in no acute distress.  HENT:  Normocephalic, atraumatic EYES: Conjunctivae and EOM are normal. No scleral icterus.  NECK: Normal range of motion SKIN: Skin is warm and dry. No rash noted. Not diaphoretic.No pallor. NEUROLGIC: Alert and oriented to person, place, and time. Normal coordination.  Abd: Soft, nontender and nondistended Pelvic: Normal appearing external genitalia; normal appearing vaginal mucosa and cervix.  Normal discharge.  Physiologic in appearance. Small uterus, no other palpable masses, no uterine or adnexal tenderness  UA: neg Assessment & Plan:  Micah was seen today for vaginal discharge.  Diagnoses and all orders for this visit:  Vaginal discharge -     Cervicovaginal ancillary only( )  Dysuria -     POCT urinalysis dipstick  Will f/u Affirm test.  f/u prn  Natascha Edmonds L. Harraway-Smith, M.D.,  [place]

## 2020-11-08 NOTE — Progress Notes (Signed)
Pt presents today for vaginal discharge with odor for a couple weeks. States she is also having some pelvic pressure. Pt states she also feels tender near her urethra.

## 2020-11-11 LAB — CERVICOVAGINAL ANCILLARY ONLY
Bacterial Vaginitis (gardnerella): NEGATIVE
Candida Glabrata: NEGATIVE
Candida Vaginitis: NEGATIVE
Comment: NEGATIVE
Comment: NEGATIVE
Comment: NEGATIVE

## 2021-02-10 IMAGING — MG DIGITAL SCREENING BILAT W/ TOMO W/ CAD
8 series · 8 of 24 positions shown · non-contrast
Comparison: Previous exam(s).

CLINICAL DATA: Screening.

EXAM:
DIGITAL SCREENING BILATERAL MAMMOGRAM WITH TOMO AND CAD

[R CC synth-2D]
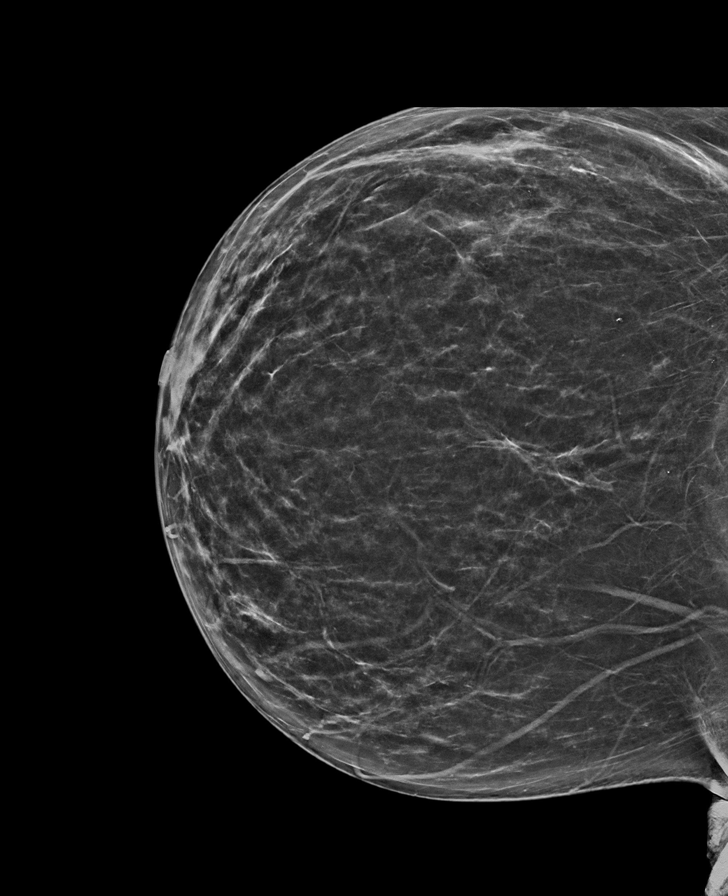

[L CC synth-2D]
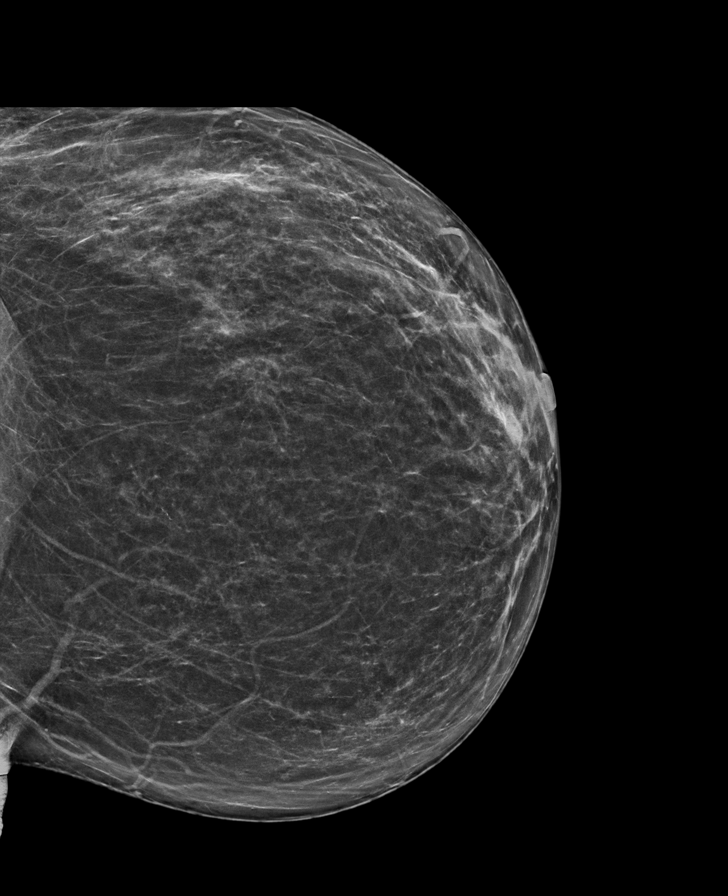

[R MLO synth-2D]
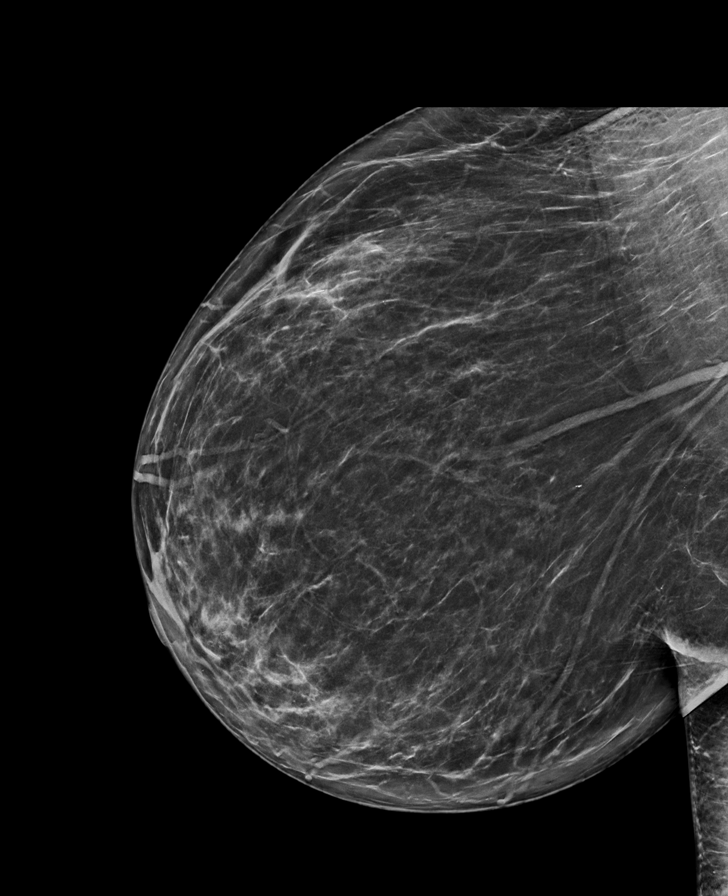

[L MLO synth-2D]
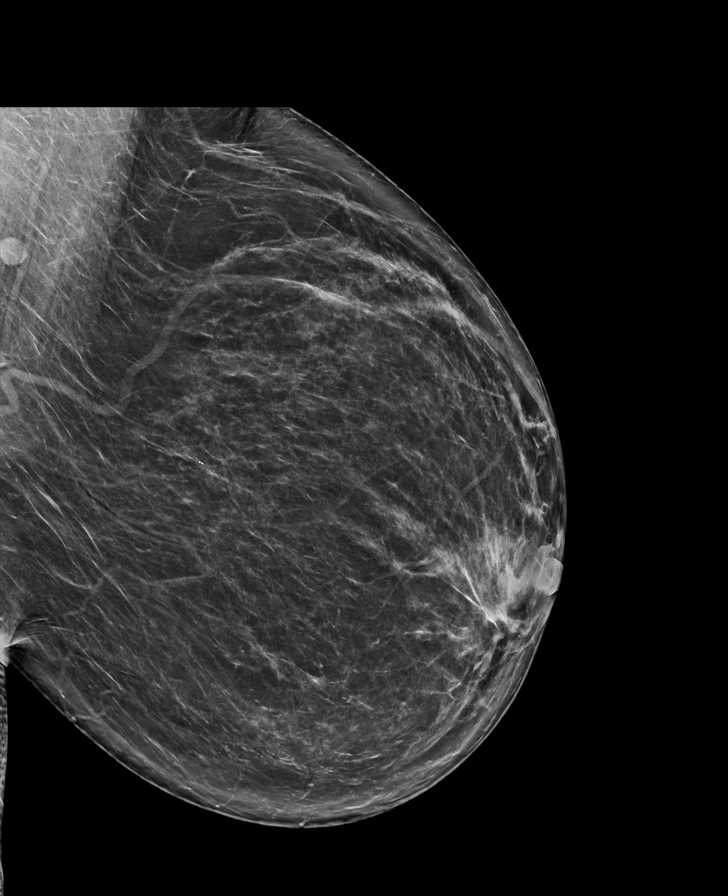

[L MLO tomo · tomo slice 41/80.0]
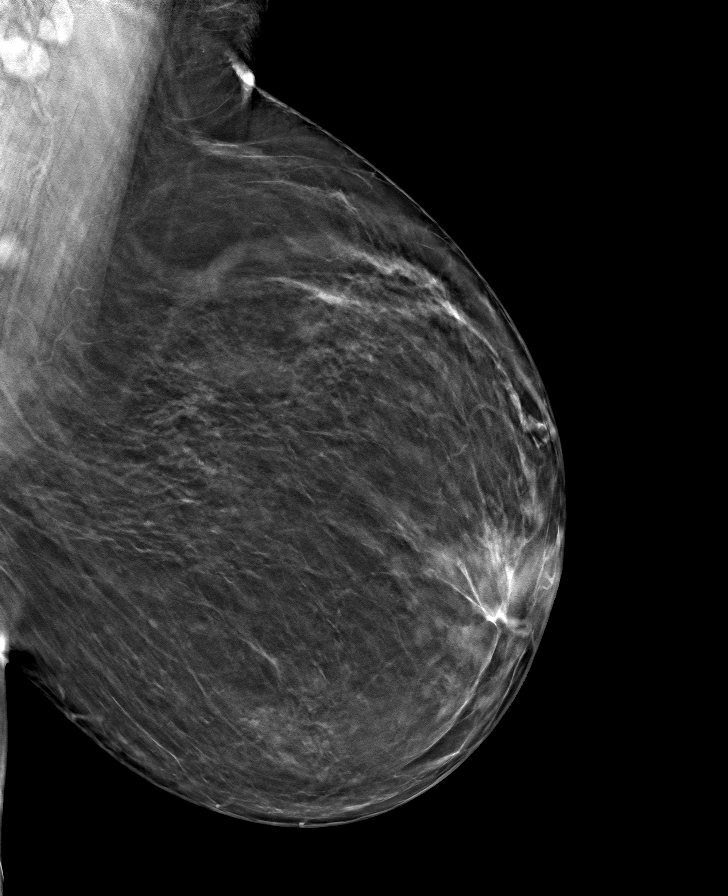

[R CC tomo · tomo slice 35/69.0]
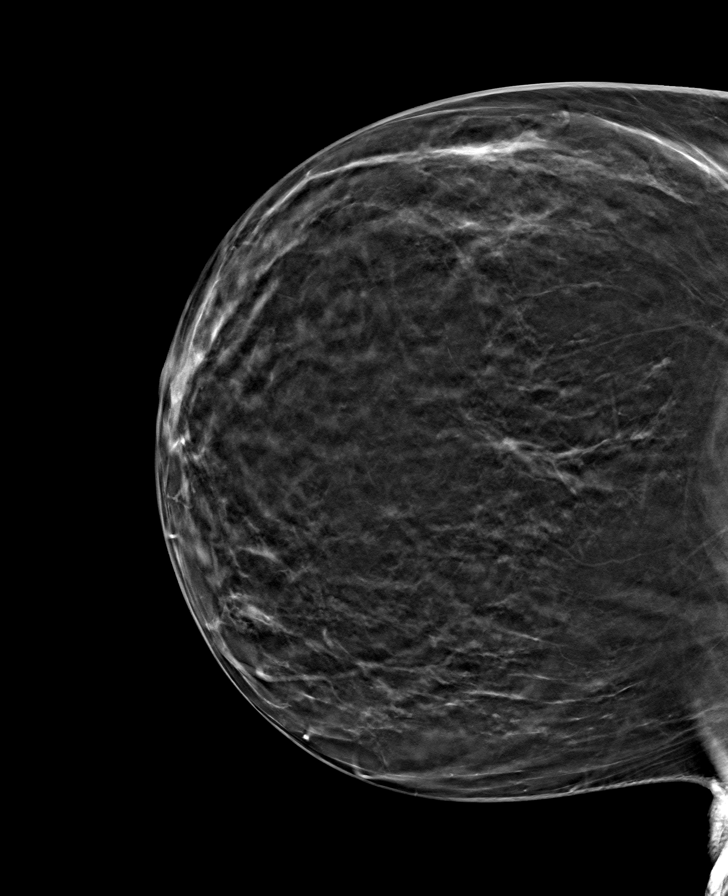

[L CC tomo · tomo slice 36/71.0]
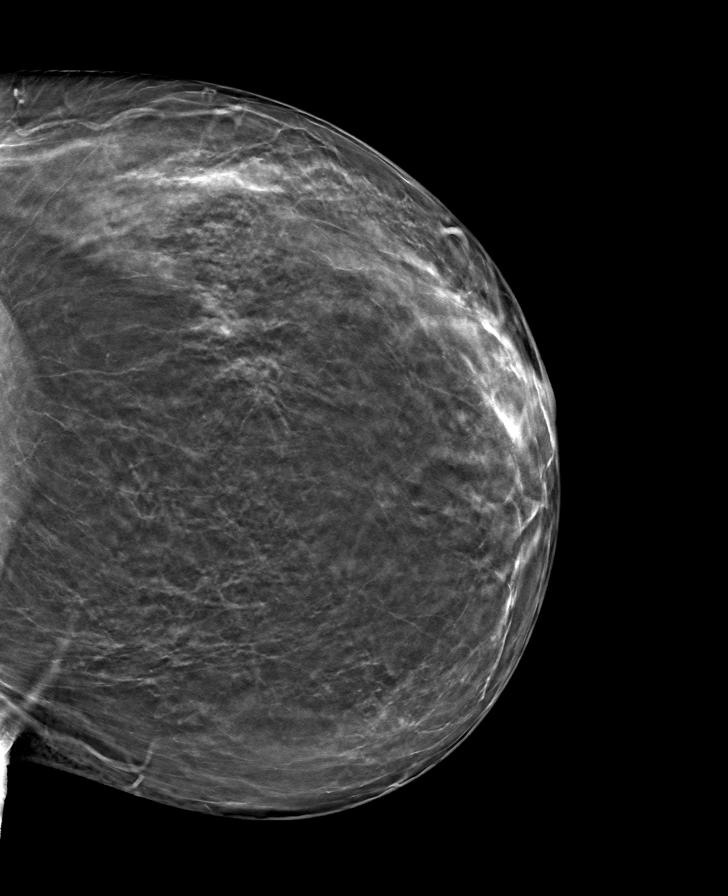

[R MLO tomo · tomo slice 39/77.0]
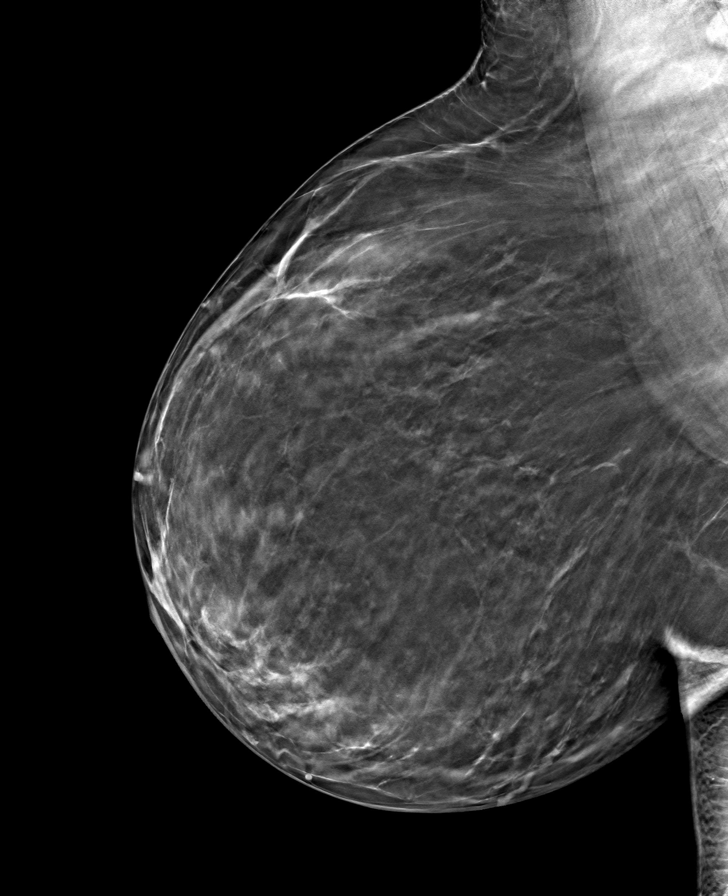

[8 of 24 positions shown; findings below may reference images not displayed]

ACR Breast Density Category b: There are scattered areas of
fibroglandular density.
FINDINGS: There are no findings suspicious for malignancy. Images were
processed with CAD.
IMPRESSION: No mammographic evidence of malignancy. A result letter of this
screening mammogram will be mailed directly to the patient.

RECOMMENDATION:
Screening mammogram in one year. (Code:[id number])

BI-RADS CATEGORY  1: Negative.

## 2021-03-20 ENCOUNTER — Other Ambulatory Visit: Payer: Self-pay | Admitting: Obstetrics & Gynecology

## 2021-03-20 DIAGNOSIS — Z1231 Encounter for screening mammogram for malignant neoplasm of breast: Secondary | ICD-10-CM

## 2021-06-17 ENCOUNTER — Other Ambulatory Visit: Payer: Self-pay

## 2021-06-17 ENCOUNTER — Ambulatory Visit
Admission: RE | Admit: 2021-06-17 | Discharge: 2021-06-17 | Disposition: A | Discharge status: Home / Self Care | Payer: Managed Care, Other (non HMO) | Source: Ambulatory Visit | Attending: Obstetrics & Gynecology | Admitting: Obstetrics & Gynecology

## 2021-06-17 DIAGNOSIS — Z1231 Encounter for screening mammogram for malignant neoplasm of breast: Secondary | ICD-10-CM

## 2021-08-01 ENCOUNTER — Encounter (HOSPITAL_BASED_OUTPATIENT_CLINIC_OR_DEPARTMENT_OTHER): Payer: Self-pay

## 2021-08-01 ENCOUNTER — Emergency Department (HOSPITAL_BASED_OUTPATIENT_CLINIC_OR_DEPARTMENT_OTHER): Payer: Managed Care, Other (non HMO) | Admitting: Radiology

## 2021-08-01 ENCOUNTER — Emergency Department (HOSPITAL_BASED_OUTPATIENT_CLINIC_OR_DEPARTMENT_OTHER)
Admission: EM | Admit: 2021-08-01 | Discharge: 2021-08-01 | Disposition: A | Discharge status: Home / Self Care | Payer: Managed Care, Other (non HMO) | Attending: Emergency Medicine | Admitting: Emergency Medicine

## 2021-08-01 ENCOUNTER — Other Ambulatory Visit: Payer: Self-pay

## 2021-08-01 ENCOUNTER — Emergency Department (HOSPITAL_BASED_OUTPATIENT_CLINIC_OR_DEPARTMENT_OTHER): Payer: Managed Care, Other (non HMO)

## 2021-08-01 DIAGNOSIS — R1032 Left lower quadrant pain: Secondary | ICD-10-CM | POA: Diagnosis present

## 2021-08-01 DIAGNOSIS — R002 Palpitations: Secondary | ICD-10-CM | POA: Insufficient documentation

## 2021-08-01 DIAGNOSIS — R197 Diarrhea, unspecified: Secondary | ICD-10-CM | POA: Diagnosis not present

## 2021-08-01 DIAGNOSIS — R1012 Left upper quadrant pain: Secondary | ICD-10-CM

## 2021-08-01 DIAGNOSIS — E119 Type 2 diabetes mellitus without complications: Secondary | ICD-10-CM | POA: Diagnosis not present

## 2021-08-01 LAB — MAGNESIUM: Magnesium: 1.9 mg/dL (ref 1.7–2.4)

## 2021-08-01 LAB — COMPREHENSIVE METABOLIC PANEL
ALT: 21 U/L (ref 0–44)
AST: 24 U/L (ref 15–41)
Albumin: 4.3 g/dL (ref 3.5–5.0)
Alkaline Phosphatase: 44 U/L (ref 38–126)
Anion gap: 10 (ref 5–15)
BUN: 10 mg/dL (ref 6–20)
CO2: 25 mmol/L (ref 22–32)
Calcium: 9.5 mg/dL (ref 8.9–10.3)
Chloride: 101 mmol/L (ref 98–111)
Creatinine, Ser: 0.97 mg/dL (ref 0.44–1.00)
GFR, Estimated: >60 mL/min (ref >60)
Glucose, Bld: 180 mg/dL — ABNORMAL HIGH (ref 70–99)
Potassium: 3.9 mmol/L (ref 3.5–5.1)
Sodium: 136 mmol/L (ref 135–145)
Total Bilirubin: 0.3 mg/dL (ref 0.3–1.2)
Total Protein: 7.8 g/dL (ref 6.5–8.1)

## 2021-08-01 LAB — CBC
HCT: 40.5 % (ref 36.0–46.0)
Hemoglobin: 13 g/dL (ref 12.0–15.0)
MCH: 26.1 pg (ref 26.0–34.0)
MCHC: 32.1 g/dL (ref 30.0–36.0)
MCV: 81.2 fL (ref 80.0–100.0)
Platelets: 318 10*3/uL (ref 150–400)
RBC: 4.99 MIL/uL (ref 3.87–5.11)
RDW: 15.6 % — ABNORMAL HIGH (ref 11.5–15.5)
WBC: 4.6 10*3/uL (ref 4.0–10.5)
nRBC: 0 % (ref 0.0–0.2)

## 2021-08-01 LAB — URINALYSIS, ROUTINE W REFLEX MICROSCOPIC
Bilirubin Urine: NEGATIVE
Glucose, UA: NEGATIVE mg/dL
Hgb urine dipstick: NEGATIVE
Ketones, ur: NEGATIVE mg/dL
Leukocytes,Ua: NEGATIVE
Nitrite: NEGATIVE
Protein, ur: NEGATIVE mg/dL
Specific Gravity, Urine: 1.02 (ref 1.005–1.030)
pH: 7.5 (ref 5.0–8.0)

## 2021-08-01 LAB — TROPONIN I (HIGH SENSITIVITY): Troponin I (High Sensitivity): <2 ng/L (ref <18)

## 2021-08-01 LAB — PREGNANCY, URINE: Preg Test, Ur: NEGATIVE

## 2021-08-01 LAB — LIPASE, BLOOD: Lipase: 47 U/L (ref 11–51)

## 2021-08-01 MED ORDER — SODIUM CHLORIDE 0.9 % IV BOLUS
1000.0000 mL | Freq: Once | INTRAVENOUS | Status: AC
  Administered 2021-08-01: 1000 mL via INTRAVENOUS

## 2021-08-01 MED ORDER — IOHEXOL 300 MG/ML  SOLN
100.0000 mL | Freq: Once | INTRAMUSCULAR | Status: AC | PRN
  Administered 2021-08-01: 100 mL via INTRAVENOUS

## 2021-08-01 NOTE — ED Triage Notes (Signed)
Pt came in POV with c/o of Abd Pain/Diarrhea/Palpitations. Pt states the abd pain started a couple days ago, accompanied by bad gas/indigestion and diarrhea. Pt described a dull/achy abd pain. Pt states that the palpitations started a couple days ago as well, but no chest pain. Pt has been taking tylenol and Advil for pain.  ?

## 2021-08-01 NOTE — ED Provider Notes (Signed)
? ?Emergency Department Provider Note ? ? ?I have reviewed the triage vital signs and the nursing notes. ? ? ?HISTORY ? ?Chief Complaint ?Abdominal Pain, Diarrhea, and Palpitations ? ? ?HPI ?Lindsay Gardner is a 46 y.o. female past medical history reviewed below presents the emergency department for evaluation of abdominal/chest discomfort along with diarrhea and heart palpitations.  Symptoms been developing over the past week.  Patient takes magnesium citrate at home to help with constipation issues as well as patient report of low magnesium.  She is continued this along with oral fluids and Gatorade.  She is noticed gas-like pain throughout her abdomen, chest, and lower back.  Symptoms seem to be worse with eating.  No exertional or pleuritic pain in the chest.  No fevers or chills.  No UTI symptoms.  She has frequent belching as well. ? ? ?Past Medical History:  ?Diagnosis Date  ? Anemia   ? Diabetes mellitus without complication (HCC)   ? GERD (gastroesophageal reflux disease)   ? IBS (irritable bowel syndrome)   ? PCOS (polycystic ovarian syndrome)   ? ? ?Review of Systems ? ?Constitutional: No fever/chills ?Eyes: No visual changes. ?ENT: No sore throat. ?Cardiovascular: Positive chest pain and palpitations.  ?Respiratory: Denies shortness of breath. ?Gastrointestinal: Positive abdominal pain. Mild nausea, no vomiting. Positive diarrhea.  No constipation. ?Genitourinary: Negative for dysuria. ?Musculoskeletal: Negative for back pain. ?Skin: Negative for rash. ?Neurological: Negative for headaches, focal weakness or numbness. ? ? ?____________________________________________ ? ? ?PHYSICAL EXAM: ? ?VITAL SIGNS: ?ED Triage Vitals  ?Enc Vitals Group  ?   BP 08/01/21 0929 (!) 132/92  ?   Pulse Rate 08/01/21 0929 92  ?   Resp 08/01/21 0929 17  ?   Temp 08/01/21 0929 98.7 ?F (37.1 ?C)  ?   Temp Source 08/01/21 0929 Oral  ?   SpO2 08/01/21 0929 98 %  ?   Weight 08/01/21 0930 180 lb (81.6 kg)  ?   Height 08/01/21 0930  5\' 1"  (1.549 m)  ? ? ?Constitutional: Alert and oriented. Well appearing and in no acute distress. ?Eyes: Conjunctivae are normal.  ?Head: Atraumatic. ?Nose: No congestion/rhinnorhea. ?Mouth/Throat: Mucous membranes are slightly dry.  ?Neck: No stridor.  ?Cardiovascular: Normal rate, regular rhythm. Good peripheral circulation. Grossly normal heart sounds.   ?Respiratory: Normal respiratory effort.  No retractions. Lungs CTAB. ?Gastrointestinal: Soft with mild LUQ tenderness. No rebound or guarding. No RUQ tenderness or Murphy's sign. No distention.  ?Musculoskeletal: No gross deformities of extremities. ?Neurologic:  Normal speech and language.  ?Skin:  Skin is warm, dry and intact. No rash noted. ? ? ?____________________________________________ ?  ?LABS ?(all labs ordered are listed, but only abnormal results are displayed) ? ?Labs Reviewed  ?COMPREHENSIVE METABOLIC PANEL - Abnormal; Notable for the following components:  ?    Result Value  ? Glucose, Bld 180 (*)   ? All other components within normal limits  ?CBC - Abnormal; Notable for the following components:  ? RDW 15.6 (*)   ? All other components within normal limits  ?LIPASE, BLOOD  ?URINALYSIS, ROUTINE W REFLEX MICROSCOPIC  ?MAGNESIUM  ?PREGNANCY, URINE  ?TROPONIN I (HIGH SENSITIVITY)  ? ?____________________________________________ ? ?EKG ? ? EKG Interpretation ? ?Date/Time:  Friday August 01 2021 09:25:23 EST ?Ventricular Rate:  91 ?PR Interval:  169 ?QRS Duration: 83 ?QT Interval:  355 ?QTC Calculation: 437 ?R Axis:   21 ?Text Interpretation: Sinus rhythm Confirmed by [id number] [phone]) on 08/01/2021 9:47:12 AM ?  ? ?  ? ? ?  ____________________________________________ ? ? ?PROCEDURES ? ?Procedure(s) performed:  ? ?Procedures ? ?None ?____________________________________________ ? ? ?INITIAL IMPRESSION / ASSESSMENT AND PLAN / ED COURSE ? ?Pertinent labs & imaging results that were available during my care of the patient were reviewed by me and  considered in my medical decision making (see chart for details). ?  ?This patient is Presenting for Evaluation of abdominal pain, which does require a range of treatment options, and is a complaint that involves a high risk of morbidity and mortality. ? ?The Differential Diagnoses includes but is not exclusive to acute cholecystitis, intrathoracic causes for epigastric abdominal pain, gastritis, duodenitis, pancreatitis, small bowel or large bowel obstruction, abdominal aortic aneurysm, hernia, gastritis, etc. ? ? ?Critical Interventions-  ?  ?Medications  ?sodium chloride 0.9 % bolus 1,000 mL (0 mLs Intravenous Stopped 08/01/21 1140)  ?iohexol (OMNIPAQUE) 300 MG/ML solution 100 mL (100 mLs Intravenous Contrast Given 08/01/21 1037)  ? ? ?Reassessment after intervention:  Patient feeling improved.  ? ? ?I decided to review pertinent External Data, and in summary last PCP physical on 02/03/21 in the [facility] system. ?  ?Clinical Laboratory Tests Ordered, included CBC without leukocytosis. UA negative for infection. CMP without AKI or abnormal LFTs or bili. Preg negative.  ? ?Radiologic Tests Ordered, included CT abdomen/pelvis along with CXR. I independently interpreted the images and agree with radiology interpretation.  ? ?Cardiac Monitor Tracing which shows NSR. ? ? ?Social Determinants of Health Risk patient with a smoking history.  ? ? ?Medical Decision Making: Summary:  ?Patient was seen emergency department with mainly GI type symptoms including abdominal pain with diarrhea.  She has belching and pain worse with eating.  She describes some of this discomfort radiating up into the chest.  Exceedingly low suspicion for PE.  Vital signs are within normal limits.  EKG interpreted here and reassuring.  Plan for chest x-ray and troponin along with labs.  She does have focal tenderness on exam, in the left upper quadrant, plan for CT imaging and reassess. ? ?Reevaluation with update and discussion with CT imaging as  above. Reviewed with patient. Discussed plan for supportive care and PCP follow up. No RUQ tenderness to prompt [place].  ? ?Disposition: discharge.  ? ?____________________________________________ ? ?FINAL CLINICAL IMPRESSION(S) / ED DIAGNOSES ? ?Final diagnoses:  ?Left upper quadrant abdominal pain  ?Diarrhea, unspecified type  ? ? ?Note:  This document was prepared using Dragon voice recognition software and may include unintentional dictation errors. ? ?Alona Bene, MD, FACEP ?Emergency Medicine ? ?  ?Maia Plan, MD ?08/02/21 1442 ? ?

## 2021-08-01 NOTE — Discharge Instructions (Signed)

## 2022-07-26 ENCOUNTER — Encounter (HOSPITAL_BASED_OUTPATIENT_CLINIC_OR_DEPARTMENT_OTHER): Payer: Self-pay | Admitting: Urology

## 2022-07-26 ENCOUNTER — Emergency Department (HOSPITAL_BASED_OUTPATIENT_CLINIC_OR_DEPARTMENT_OTHER): Payer: BC Managed Care – PPO

## 2022-07-26 ENCOUNTER — Emergency Department (HOSPITAL_BASED_OUTPATIENT_CLINIC_OR_DEPARTMENT_OTHER)
Admission: EM | Admit: 2022-07-26 | Discharge: 2022-07-26 | Disposition: A | Discharge status: Home / Self Care | Payer: BC Managed Care – PPO | Attending: Emergency Medicine | Admitting: Emergency Medicine

## 2022-07-26 ENCOUNTER — Other Ambulatory Visit: Payer: Self-pay

## 2022-07-26 DIAGNOSIS — Z7984 Long term (current) use of oral hypoglycemic drugs: Secondary | ICD-10-CM | POA: Insufficient documentation

## 2022-07-26 DIAGNOSIS — S46912A Strain of unspecified muscle, fascia and tendon at shoulder and upper arm level, left arm, initial encounter: Secondary | ICD-10-CM | POA: Diagnosis not present

## 2022-07-26 DIAGNOSIS — E119 Type 2 diabetes mellitus without complications: Secondary | ICD-10-CM | POA: Insufficient documentation

## 2022-07-26 DIAGNOSIS — M25512 Pain in left shoulder: Secondary | ICD-10-CM

## 2022-07-26 DIAGNOSIS — X58XXXA Exposure to other specified factors, initial encounter: Secondary | ICD-10-CM | POA: Insufficient documentation

## 2022-07-26 DIAGNOSIS — T148XXA Other injury of unspecified body region, initial encounter: Secondary | ICD-10-CM

## 2022-07-26 LAB — CBC
HCT: 42.4 % (ref 36.0–46.0)
Hemoglobin: 13.7 g/dL (ref 12.0–15.0)
MCH: 26.6 pg (ref 26.0–34.0)
MCHC: 32.3 g/dL (ref 30.0–36.0)
MCV: 82.3 fL (ref 80.0–100.0)
Platelets: 323 10*3/uL (ref 150–400)
RBC: 5.15 MIL/uL — ABNORMAL HIGH (ref 3.87–5.11)
RDW: 15 % (ref 11.5–15.5)
WBC: 4.3 10*3/uL (ref 4.0–10.5)
nRBC: 0 % (ref 0.0–0.2)

## 2022-07-26 LAB — BASIC METABOLIC PANEL
Anion gap: 8 (ref 5–15)
BUN: 11 mg/dL (ref 6–20)
CO2: 22 mmol/L (ref 22–32)
Calcium: 9 mg/dL (ref 8.9–10.3)
Chloride: 103 mmol/L (ref 98–111)
Creatinine, Ser: 0.97 mg/dL (ref 0.44–1.00)
GFR, Estimated: >60 mL/min (ref >60)
Glucose, Bld: 111 mg/dL — ABNORMAL HIGH (ref 70–99)
Potassium: 3.9 mmol/L (ref 3.5–5.1)
Sodium: 133 mmol/L — ABNORMAL LOW (ref 135–145)

## 2022-07-26 LAB — TROPONIN I (HIGH SENSITIVITY): Troponin I (High Sensitivity): <2 ng/L (ref <18)

## 2022-07-26 MED ORDER — CYCLOBENZAPRINE HCL 5 MG PO TABS: 10.0000 mg | ORAL_TABLET | Freq: Three times a day (TID) | ORAL | 0 refills | Status: AC | PRN

## 2022-07-26 MED ORDER — LIDOCAINE 5 % EX PTCH: 1.0000 | MEDICATED_PATCH | CUTANEOUS | 0 refills | Status: AC

## 2022-07-26 NOTE — Discharge Instructions (Addendum)
Return to the ER if he start having chest pain, worsening shoulder pain, shortness of breath, inability to move your arms, weakness or numbness down 1 arm.  Take the muscle relaxers as needed, as well as ibuprofen or Tylenol for your pain control.

## 2022-07-26 NOTE — ED Notes (Signed)
Discharge paperwork reviewed entirely with patient, including Rx's and follow up care. Pain was under control. Pt verbalized understanding as well as all parties involved. No questions or concerns voiced at the time of discharge. No acute distress noted.   Pt ambulated out to PVA without incident or assistance.

## 2022-07-26 NOTE — ED Provider Notes (Signed)
[facility] Provider Note   CSN: [id number] Arrival date & time: 07/26/22  1032     History  Chief Complaint  Patient presents with   Arm Pain    Lindsay Gardner is a 47 y.o. female, history of diabetes, who presents to the ED secondary to left shoulder pain, rating down the left arm, and left upper chest wall, states has been going on for the last couple days, and that it is achy in nature, worse with range of motion.  Think she may have pulled a muscle, but is not sure.  Pain is a 4-5 out of 10.  Has not taken anything for the pain, but wants to make sure her heart is okay secondary to having history of diabetes.  Denies any shortness of breath, nausea, or vomiting.  Also states she has been having more belching.  Last bowel movement was this morning, is passing gas.    Home Medications Prior to Admission medications   Medication Sig Start Date End Date Taking? Authorizing Provider  cyclobenzaprine (FLEXERIL) 5 MG tablet Take 2 tablets (10 mg total) by mouth 3 (three) times daily as needed for muscle spasms. 07/26/22  Yes Naliya Gish L, PA  lidocaine (LIDODERM) 5 % Place 1 patch onto the skin daily. Remove & Discard patch within 12 hours or as directed by MD 07/26/22  Yes Jennings Corado, Si Gaul, PA  ALPRAZolam (XANAX) 0.25 MG tablet alprazolam 0.25 mg tablet 08/24/18   [provider]  Ascorbic Acid (VITAMIN C) 1000 MG tablet  03/26/19   [provider]  B Complex-C (SUPER B COMPLEX/VITAMIN C) TABS  11/23/19   [provider]  Cholecalciferol (VITAMIN D3) 250 MCG (10000 UT) capsule  05/26/15   [provider]  Dulaglutide (TRULICITY) A999333 0000000 SOPN  04/06/17   [provider]  ferrous fumarate-iron polysaccharide complex (TANDEM) 162-115.2 MG CAPS capsule     [provider]  fexofenadine-pseudoephedrine (ALLEGRA-D 24) 180-240 MG 24 hr tablet  05/25/18   [provider]  Ginger, Zingiber  officinalis, (GINGER PO) Take 2 capsules by mouth daily.    [provider]  glimepiride (AMARYL) 4 MG tablet glimepiride 4 mg tablet 05/25/18   [provider]  hyoscyamine (LEVSIN, ANASPAZ) 0.125 MG tablet Take 2 tablets (0.25 mg total) by mouth every 6 (six) hours as needed. 10/25/13   Jola Schmidt, MD  ibuprofen (ADVIL,MOTRIN) 200 MG tablet Take 200 mg by mouth every 6 (six) hours as needed. For pain    [provider]  lansoprazole (PREVACID) 30 MG capsule Take 30 mg by mouth daily at 12 noon.    [provider]  MACA ROOT PO Take 2 tablets by mouth daily.    [provider]  Magnesium 250 MG TABS  11/23/19   [provider]  metroNIDAZOLE (FLAGYL) 500 MG tablet Take 500 mg by mouth 4 (four) times daily.    [provider]  Multiple Vitamins tablet Take by mouth.    [provider]  Omega-3 Fatty Acids (FISH OIL) 1000 MG CAPS Fish Oil    [provider]  tranexamic acid (LYSTEDA) 650 MG TABS tablet Take 2 tablets (1,300 mg total) by mouth 3 (three) times daily. Take during menses for a maximum of five days 02/21/20   Lavonia Drafts, MD      Allergies    Ciprofloxacin    Review of Systems   Review of Systems  Physical Exam Updated  Vital Signs BP (!) 138/96 (BP Location: Left Arm)   Pulse 97   Temp 98.2 F (36.8 C) (Oral)   Resp 20   Ht '5\' 1"'$  (1.549 m)   Wt 81.6 kg   SpO2 100%   BMI 33.99 kg/m  Physical Exam Vitals and nursing note reviewed.  Constitutional:      General: She is not in acute distress.    Appearance: She is well-developed.  HENT:     Head: Normocephalic and atraumatic.  Eyes:     Conjunctiva/sclera: Conjunctivae normal.  Cardiovascular:     Rate and Rhythm: Normal rate and regular rhythm.     Heart sounds: No murmur heard. Pulmonary:     Effort: Pulmonary effort is normal. No respiratory distress.     Breath sounds: Normal breath sounds.  Abdominal:     Palpations:  Abdomen is soft.     Tenderness: There is no abdominal tenderness.  Musculoskeletal:        General: No swelling.     Cervical back: Neck supple.     Comments: Tenderness to palpation along left trapezius, improved with abduction of arms, worse with internal or external rotation of the arm.  Pain ranges from left shoulder to left chest wall.  Tenderness to palpation that is diffuse.  No rash, erythema or edema.  Tenderness to palpation of anterior/posterior upper arm.  Skin:    General: Skin is warm and dry.     Capillary Refill: Capillary refill takes less than 2 seconds.  Neurological:     Mental Status: She is alert.  Psychiatric:        Mood and Affect: Mood normal.     ED Results / Procedures / Treatments   Labs (all labs ordered are listed, but only abnormal results are displayed) Labs Reviewed  BASIC METABOLIC PANEL - Abnormal; Notable for the following components:      Result Value   Sodium 133 (*)    Glucose, Bld 111 (*)    All other components within normal limits  CBC - Abnormal; Notable for the following components:   RBC 5.15 (*)    All other components within normal limits  PREGNANCY, URINE  TROPONIN I (HIGH SENSITIVITY)    EKG None  Radiology DG Chest 2 View  Result Date: 07/26/2022 CLINICAL DATA:  Left arm pain EXAM: CHEST - 2 VIEW COMPARISON:  Chest x-ray August 01, 2021 FINDINGS: The heart size and mediastinal contours are within normal limits. Both lungs are clear. The visualized skeletal structures are unremarkable. IMPRESSION: No active cardiopulmonary disease. Electronically Signed   By: Dorise Bullion III M.D.   On: 07/26/2022 11:23    Procedures Procedures    Medications Ordered in ED Medications - No data to display  ED Course/ Medical Decision Making/ A&P                             Medical Decision Making Patient is a 47 year old female, here for chest wall pain, shoulder pain has been going on for the last couple days states she think she  may have pulled something, but wants her to heart checked out.  Will obtain EKG, CBC, CMP, troponin for further evaluation.  As well as chest x-ray.  She has no neurodeficits on exam, and pain appears to be in a musculoskeletal pattern.  Amount and/or Complexity of Data Reviewed Labs: ordered.    Details: Troponin within normal limits. Radiology: ordered.  Details: Chest x-ray clear ECG/medicine tests:     Details: Normal sinus rhythm. Discussion of management or test interpretation with external provider(s): Discussed with patient, her pain appears to be musculoskeletal, we will discharge her with Flexeril, Toradol, and lidocaine patches to help with the pain.  She is PERC negative, and she is not short of breath.  Is overall well-appearing, and does not appear to be in any acute distress.  Heart score is 2.  Strep negative, no abdominal pain, shortness of breath.  We discussed the importance of following with primary care doctor, for further evaluation.  Is having belching, but also passing gas, and having normal bowel movements thus I think obstruction is unlikely.  She also has no abdominal tenderness.    Final Clinical Impression(s) / ED Diagnoses Final diagnoses:  Muscle strain  Acute pain of left shoulder    Rx / DC Orders ED Discharge Orders          Ordered    cyclobenzaprine (FLEXERIL) 5 MG tablet  3 times daily PRN        07/26/22 1517    lidocaine (LIDODERM) 5 %  Every 24 hours        07/26/22 1517              Liona Wengert Carlean Jews, PA 07/26/22 1518    Charlesetta Shanks, MD 07/29/22 1757

## 2022-07-26 NOTE — ED Triage Notes (Signed)
Pt sent from UC, states left arm pain radiating to left neck x 5 days  Reports more belching than normal, pain better after belching  States UC EKG was normal   Pt states on computer 16 hours a day and uses left arm in repetitive motion  States pain worse with movement to left arm

## 2022-08-07 ENCOUNTER — Other Ambulatory Visit: Payer: Self-pay | Admitting: Obstetrics & Gynecology

## 2022-08-07 DIAGNOSIS — Z1231 Encounter for screening mammogram for malignant neoplasm of breast: Secondary | ICD-10-CM

## 2022-09-22 ENCOUNTER — Ambulatory Visit
Admission: RE | Admit: 2022-09-22 | Discharge: 2022-09-22 | Disposition: A | Discharge status: Home / Self Care | Payer: PRIVATE HEALTH INSURANCE | Source: Ambulatory Visit | Attending: Obstetrics & Gynecology | Admitting: Obstetrics & Gynecology

## 2022-09-22 DIAGNOSIS — Z1231 Encounter for screening mammogram for malignant neoplasm of breast: Secondary | ICD-10-CM

## 2022-09-30 ENCOUNTER — Ambulatory Visit: Payer: PRIVATE HEALTH INSURANCE | Admitting: Obstetrics & Gynecology

## 2022-12-16 ENCOUNTER — Other Ambulatory Visit (HOSPITAL_COMMUNITY): Admission: RE | Admit: 2022-12-16 | Payer: BC Managed Care – PPO | Source: Ambulatory Visit

## 2022-12-16 ENCOUNTER — Ambulatory Visit (INDEPENDENT_AMBULATORY_CARE_PROVIDER_SITE_OTHER): Payer: BC Managed Care – PPO | Admitting: Obstetrics & Gynecology

## 2022-12-16 ENCOUNTER — Encounter: Payer: Self-pay | Admitting: Obstetrics & Gynecology

## 2022-12-16 VITALS — BP 110/79 | HR 93 | Ht 60.0 in | Wt 183.0 lb

## 2022-12-16 DIAGNOSIS — N924 Excessive bleeding in the premenopausal period: Secondary | ICD-10-CM | POA: Diagnosis not present

## 2022-12-16 DIAGNOSIS — N951 Menopausal and female climacteric states: Secondary | ICD-10-CM | POA: Diagnosis not present

## 2022-12-16 DIAGNOSIS — Z01419 Encounter for gynecological examination (general) (routine) without abnormal findings: Secondary | ICD-10-CM | POA: Diagnosis not present

## 2022-12-16 DIAGNOSIS — Z01411 Encounter for gynecological examination (general) (routine) with abnormal findings: Secondary | ICD-10-CM | POA: Diagnosis not present

## 2022-12-16 DIAGNOSIS — N939 Abnormal uterine and vaginal bleeding, unspecified: Secondary | ICD-10-CM

## 2022-12-16 NOTE — Progress Notes (Signed)
Patient states last episode of bleeding was Oct 06, 2022. Is currently having some spotting now. Armandina Stammer RN

## 2022-12-16 NOTE — Progress Notes (Signed)
Subjective:     Lindsay Gardner is a 47 y.o. female here for a routine exam.  Current complaints: Pt reports spotting since May. She reports that she is also having sleep issues and vaginal dryness. She feels that her sx are managed with a fan at present.   She denies sx assoc with the bleeding- no pain or weight loss.   Has not had a colonoscopy. Has an appt now for a GI consult.     Gynecologic History Patient's last menstrual period was 12/13/2022. Contraception: none Last Pap: 02/21/2020 Results were: normal Last mammogram: 09/22/2022. Results were: normal  Obstetric History OB History  Gravida Para Term Preterm AB Living  1 0     1 0  SAB IAB Ectopic Multiple Live Births  1       0    # Outcome Date GA Lbr Len/2nd Weight Sex Type Anes PTL Lv  1 SAB              The following portions of the patient's history were reviewed and updated as appropriate: allergies, current medications, past family history, past medical history, past social history, past surgical history, and problem list.  Review of Systems Pertinent items are noted in HPI.    Objective:  BP 110/79   Pulse 93   Ht 5' (1.524 m)   Wt 183 lb (83 kg)   LMP 12/13/2022 Comment: spotting  BMI 35.74 kg/m  General Appearance:    Alert, cooperative, no distress, appears stated age  Head:    Normocephalic, without obvious abnormality, atraumatic  Eyes:    conjunctiva/corneas clear, EOM's intact, both eyes  Ears:    Normal external ear canals, both ears  Nose:   Nares normal, septum midline, mucosa normal, no drainage    or sinus tenderness  Throat:   Lips, mucosa, and tongue normal; teeth and gums normal  Neck:   Supple, symmetrical, trachea midline, no adenopathy;    thyroid:  no enlargement/tenderness/nodules  Back:     Symmetric, no curvature, ROM normal, no CVA tenderness  Lungs:     respirations unlabored  Chest Wall:    No tenderness or deformity   Heart:    Regular rate and rhythm  Breast Exam:    No  tenderness, masses, or nipple abnormality  Abdomen:     Soft, non-tender, bowel sounds active all four quadrants,    no masses, no organomegaly  Genitalia:    Normal female without lesion, discharge or tenderness   Uterus- small and mobile. Small amount of blood in vault.    Extremities:   Extremities normal, atraumatic, no cyanosis or edema  Pulses:   2+ and symmetric all extremities  Skin:   Skin color, texture, turgor normal, no rashes or lesions      Assessment:    Healthy female exam.  AUB- suspect due to perimenopause given other sx. Will obtain labs and [place]. Will have pt f/u for ENdobx in 6 weeks    Plan:   Diagnoses and all orders for this visit:  Well female exam with routine gynecological exam -     Cytology - PAP( [place])  Abnormal uterine bleeding (AUB) -     US Pelvis Complete; Future -     US Transvaginal Non-OB; Future -     TSH -     FSH -     CBC  Abnormal perimenopausal bleeding -     TSH -     FSH -  CBC  Hot flushes, perimenopausal   F/u in 6 weeks or sooner prn for endo bx  Terez Freimark L. Harraway-Smith, M.D., [place] [place]

## 2022-12-17 LAB — CBC
Hematocrit: 40.3 % (ref 34.0–46.6)
MCHC: 32.8 g/dL (ref 31.5–35.7)
MCV: 81 fL (ref 79–97)
Platelets: 236 10*3/uL (ref 150–450)
RBC: 4.96 x10E6/uL (ref 3.77–5.28)
RDW: 14.3 % (ref 11.7–15.4)

## 2022-12-17 LAB — FOLLICLE STIMULATING HORMONE: FSH: 28.4 m[IU]/mL

## 2022-12-17 LAB — TSH: TSH: 2.08 u[IU]/mL (ref 0.450–4.500)

## 2022-12-18 ENCOUNTER — Ambulatory Visit (HOSPITAL_BASED_OUTPATIENT_CLINIC_OR_DEPARTMENT_OTHER)
Admission: RE | Admit: 2022-12-18 | Discharge: 2022-12-18 | Disposition: A | Discharge status: Home / Self Care | Payer: BC Managed Care – PPO | Source: Ambulatory Visit | Attending: Obstetrics & Gynecology | Admitting: Obstetrics & Gynecology

## 2022-12-18 DIAGNOSIS — N939 Abnormal uterine and vaginal bleeding, unspecified: Secondary | ICD-10-CM | POA: Diagnosis present

## 2023-01-11 ENCOUNTER — Telehealth: Payer: Self-pay

## 2023-01-11 NOTE — Telephone Encounter (Signed)
Patient called requesting [place] results. A message was sent to the provider. Lindsay Gardner l Lindsay Gardner, CMA

## 2023-01-27 ENCOUNTER — Ambulatory Visit: Payer: PRIVATE HEALTH INSURANCE | Admitting: Obstetrics & Gynecology

## 2023-02-17 ENCOUNTER — Telehealth (INDEPENDENT_AMBULATORY_CARE_PROVIDER_SITE_OTHER): Payer: PRIVATE HEALTH INSURANCE | Admitting: Obstetrics & Gynecology

## 2023-02-17 ENCOUNTER — Encounter: Payer: Self-pay | Admitting: Obstetrics & Gynecology

## 2023-02-17 DIAGNOSIS — D251 Intramural leiomyoma of uterus: Secondary | ICD-10-CM

## 2023-02-17 DIAGNOSIS — N939 Abnormal uterine and vaginal bleeding, unspecified: Secondary | ICD-10-CM | POA: Diagnosis not present

## 2023-02-17 DIAGNOSIS — D259 Leiomyoma of uterus, unspecified: Secondary | ICD-10-CM

## 2023-02-17 NOTE — Progress Notes (Signed)
GYNECOLOGY VIRTUAL VISIT ENCOUNTER NOTE  Provider location: [facility] for [facility]   Patient location: Home  I connected with Lindsay Gardner on 02/17/23 at  3:30 PM EDT by MyChart Video Encounter and verified that I am speaking with the correct person using two identifiers.   I discussed the limitations, risks, security and privacy concerns of performing an evaluation and management service virtually and the availability of in person appointments. I also discussed with the patient that there may be a patient responsible charge related to this service. The patient expressed understanding and agreed to proceed.   History:  Lindsay Gardner is a 47 y.o. [id number] female being evaluated today for heavy prolonged menses. Pt reports that since her last visit, her bleeding has improved. She had a normal cycle after the last bleeding stopped. She denies any abnormal vaginal discharge, bleeding, pelvic pain or other concerns.       Past Medical History:  Diagnosis Date   Anemia    Diabetes mellitus without complication (HCC)    GERD (gastroesophageal reflux disease)    IBS (irritable bowel syndrome)    PCOS (polycystic ovarian syndrome)    Past Surgical History:  Procedure Laterality Date   POLYPECTOMY     The following portions of the patient's history were reviewed and updated as appropriate: allergies, current medications, past family history, past medical history, past social history, past surgical history and problem list.     Review of Systems:  Pertinent items noted in HPI and remainder of comprehensive ROS otherwise negative.  Physical Exam:   General:  Alert, oriented and cooperative. Patient appears to be in no acute distress.  Mental Status: Normal mood and affect. Normal behavior. Normal judgment and thought content.   Respiratory: Normal respiratory effort, no problems with respiration noted  Rest of physical exam deferred due to type of  encounter  Labs and Imaging 12/18/2022 CLINICAL DATA:  Abnormal uterine bleeding   EXAM: TRANSABDOMINAL AND TRANSVAGINAL ULTRASOUND OF PELVIS   TECHNIQUE: Both transabdominal and transvaginal ultrasound examinations of the pelvis were performed. Transabdominal technique was performed for global imaging of the pelvis including uterus, ovaries, adnexal regions, and pelvic cul-de-sac. It was necessary to proceed with endovaginal exam following the transabdominal exam to visualize the endometrium.   COMPARISON:  CT abdomen pelvis 08/01/2021   FINDINGS: Uterus   Measurements: 7.5 x 5.0 x 5.1 cm = volume: 99.4 mL. There is a 4.0 x 2.7 x 3.7 cm fibroid within the anterior left uterine fundus and a 1.4 x 0.8 x 1.1 cm fibroids in the posterior right uterine body.   Endometrium   Thickness: 3.9 mm.  No focal abnormality visualized.   Right ovary   Measurements: 2.1 x 1.0 x 1.5 cm = volume: 1.7 mL. Normal appearance/no adnexal mass.   Left ovary   Measurements: 0.6 x 1.7 x 1.6 cm = volume: 3.6 mL. Normal appearance/no adnexal mass.   Other findings   No abnormal free fluid.   IMPRESSION: 1. Fibroid uterus. 2. Endometrium measures 3.9 mm. If bleeding remains unresponsive to hormonal or medical therapy, sonohysterogram should be considered for focal lesion work-up. (Ref: Radiological Reasoning: Algorithmic Workup of Abnormal Vaginal Bleeding with Endovaginal Sonography and Sonohysterography. AJR 2008; 161:W96-04)     Assessment and Plan:     AUB- suspect related to fibroids. Bleeding now improved.  D/w pt fibroids. Written info to be shared as well         I discussed  the assessment and treatment plan with the patient. The patient was provided an opportunity to ask questions and all were answered. The patient agreed with the plan and demonstrated an understanding of the instructions.   The patient was advised to call back or seek an in-person evaluation/go to the ED if  the symptoms worsen or if the condition fails to improve as anticipated.  I provided 20 minutes of face-to-face time and review of chart during this encounter.   Willodean Rosenthal, MD Center for [employer], [facility]

## 2023-02-17 NOTE — Patient Instructions (Signed)
Uterine Fibroids  Uterine fibroids, also called leiomyomas, are noncancerous (benign) tumors that can grow in the uterus. They can cause heavy menstrual bleeding and pain. Fibroids may also grow in the fallopian tubes, cervix, or tissues (ligaments) near the uterus. You may have one or many fibroids. Fibroids vary in size, weight, and where they grow in the uterus. Some can become quite large. Most fibroids do not require medical treatment. What are the causes? The cause of this condition is not known. What increases the risk? You are more likely to develop this condition if you: Are in your 30s or 40s and have not gone through menopause. Have a family history of this condition. Are of African American descent. Started your menstrual period at age 73 or younger. Have never given birth. Are overweight or obese. What are the signs or symptoms? Many women do not have any symptoms. Symptoms of this condition may include: Heavy menstrual bleeding. Bleeding between menstrual periods. Pain and pressure in the pelvic area, between your hip bones. Pain during sex. Bladder problems, such as needing to urinate right away or more often than usual. Inability to have children (infertility). Failure to carry pregnancy to term (miscarriage). How is this diagnosed? This condition may be diagnosed based on: Your symptoms and medical history. A physical exam. A pelvic exam that includes feeling for any tumors. Imaging tests, such as ultrasound or MRI. How is this treated? Treatment for this condition may include follow-up visits with your health care provider to monitor your fibroids for any changes. Other treatment may include: Medicines, such as: Medicines to relieve pain, including aspirin and NSAIDs, such as ibuprofen or naproxen. Hormone therapy. Treatment may be given as a pill or an injection, or it may be inserted into the uterus using an intrauterine device (IUD). Surgery that would do one of  the following: Remove the fibroids (myomectomy). This may be recommended if fibroids affect your fertility and you want to become pregnant. Remove the uterus (hysterectomy). Block the blood supply to the fibroids (uterine artery embolization). This can cause them to shrink and die. Follow these instructions at home: Medicines Take over-the-counter and prescription medicines only as told by your health care provider. Ask your health care provider if you should take iron pills or eat more iron-rich foods, such as dark green, leafy vegetables. Heavy menstrual bleeding can cause low iron levels. Managing pain If directed, apply heat to your back or abdomen to reduce pain. Use the heat source that your health care provider recommends, such as a moist heat pack or a heating pad. To apply heat: Place a towel between your skin and the heat source. Leave the heat on for 20-30 minutes. Remove the heat if your skin turns bright red. This is especially important if you are unable to feel pain, heat, or cold. You may have a greater risk of getting burned.  General instructions Pay close attention to your menstrual cycle. Tell your health care provider about any changes, such as: Heavier bleeding that requires you to change your pads or tampons more than usual. A change in the number of days that your menstrual period lasts. A change in symptoms that come with your menstrual period, such as back pain or cramps in your abdomen. Keep all follow-up visits. This is important, especially if your fibroids need to be monitored for any changes. Contact a health care provider if you: Have pelvic pain, back pain, or cramps in your abdomen that do not get better with  medicine or heat. Develop new bleeding between menstrual periods. Have increased bleeding during or between menstrual periods. Feel more tired or weak than usual. Feel light-headed. Get help right away if you: Faint. Have pelvic pain that suddenly  gets worse. Have severe vaginal bleeding that soaks a tampon or pad in 30 minutes or less. Summary Uterine fibroids are noncancerous (benign) tumors that can develop in the uterus. The exact cause of this condition is not known. Most fibroids do not require medical treatment unless they affect your ability to have children (fertility). Contact a health care provider if you have pelvic pain, back pain, or cramps in your abdomen that do not get better with medicines. Get help right away if you faint, have pelvic pain that suddenly gets worse, or have severe vaginal bleeding. This information is not intended to replace advice given to you by your health care provider. Make sure you discuss any questions you have with your health care provider. Document Revised: 12/12/2019 Document Reviewed: 12/12/2019 [employer] Patient Education  2024 [employer].

## 2023-05-17 ENCOUNTER — Encounter: Payer: Self-pay | Admitting: Neurosurgery
